# Patient Record
Sex: Female | Born: 1965 | Race: Black or African American | Hispanic: No | Marital: Single | Smoking: Never smoker
Health system: Southern US, Community
[De-identification: ages and names within clinical notes are randomized; demographics above are authoritative.]

---

## 2019-08-19 ENCOUNTER — Emergency Department: Payer: Self-pay

## 2019-08-19 ENCOUNTER — Emergency Department
Admission: EM | Admit: 2019-08-19 | Discharge: 2019-08-19 | Disposition: A | Discharge status: Home / Self Care | Payer: Self-pay | Attending: Emergency Medicine | Admitting: Emergency Medicine

## 2019-08-19 ENCOUNTER — Other Ambulatory Visit: Payer: Self-pay

## 2019-08-19 DIAGNOSIS — M1380 Other specified arthritis, unspecified site: Secondary | ICD-10-CM | POA: Insufficient documentation

## 2019-08-19 DIAGNOSIS — M1990 Unspecified osteoarthritis, unspecified site: Secondary | ICD-10-CM

## 2019-08-19 DIAGNOSIS — M7121 Synovial cyst of popliteal space [Baker], right knee: Secondary | ICD-10-CM | POA: Insufficient documentation

## 2019-08-19 DIAGNOSIS — M25461 Effusion, right knee: Secondary | ICD-10-CM | POA: Insufficient documentation

## 2019-08-19 MED ORDER — KETOROLAC TROMETHAMINE 30 MG/ML IJ SOLN
30.0000 mg | Freq: Once | INTRAMUSCULAR | Status: AC
  Administered 2019-08-19: 22:00:00 30 mg via INTRAMUSCULAR
  Filled 2019-08-19: qty 1

## 2019-08-19 MED ORDER — OXYCODONE-ACETAMINOPHEN 5-325 MG PO TABS
1.0000 | ORAL_TABLET | Freq: Once | ORAL | Status: AC
  Administered 2019-08-19: 1 via ORAL
  Filled 2019-08-19: qty 1

## 2019-08-19 MED ORDER — IBUPROFEN 600 MG PO TABS: 600.0000 mg | ORAL_TABLET | Freq: Four times a day (QID) | ORAL | 0 refills | Status: AC | PRN

## 2019-08-19 NOTE — ED Notes (Signed)
Pt from triage via wheelchair, pt able to transfer from wheelchair to bed on her own, A&Ox4.

## 2019-08-19 NOTE — ED Notes (Signed)
Pt alert and oriented X4, NAD noted at this time.

## 2019-08-19 NOTE — ED Triage Notes (Signed)
Pt reports new pain behind right knee that started Monday, she woke up and it was hurting, pt denies swelling.

## 2019-08-19 NOTE — ED Provider Notes (Signed)
[facility] Emergency Department Provider Note  ____________________________________________  Time seen: Approximately 8:19 PM  I have reviewed the triage vital signs and the nursing notes.   HISTORY  Chief Complaint Knee Pain    HPI Desiree Reyes is a 53 y.o. female that presents to emergency department for evaluation of right posterior knee pain for 5 days.  Patient states that she woke up 1 morning and the back of her knee was hurting.  No trauma.  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   History reviewed. No pertinent past medical history.  There are no active problems to display for this patient.   History reviewed. No pertinent surgical history.  Prior to Admission medications   Medication Sig Start Date End Date Taking? Authorizing Provider  ibuprofen (ADVIL) 600 MG tablet Take 1 tablet (600 mg total) by mouth every 6 (six) hours as needed. 08/19/19   Enid Derry, PA-C    Allergies Strawberry (diagnostic) and Tomato  History reviewed. No pertinent family history.  Social History Social History   Tobacco Use  . Smoking status: Never Smoker  . Smokeless tobacco: Never Used  Substance Use Topics  . Alcohol use: Yes  . Drug use: Never     Review of Systems  Constitutional: No fever/chills Gastrointestinal: No nausea, no vomiting.  Musculoskeletal: Positive for posterior knee pain. Skin: Negative for rash, abrasions, lacerations, ecchymosis. Neurological: Negative for numbness or tingling   ____________________________________________   PHYSICAL EXAM:  VITAL SIGNS: ED Triage Vitals  Enc Vitals Group     BP 08/19/19 1824 125/75     Pulse Rate 08/19/19 1824 91     Resp 08/19/19 1824 18     Temp 08/19/19 1824 98.5 F (36.9 C)     Temp Source 08/19/19 1824 Oral     SpO2 08/19/19 1824 99 %     Weight 08/19/19 1825 198 lb (89.8 kg)     Height 08/19/19 1825 5' (1.524 m)     Head  Circumference --      Peak Flow --      Pain Score 08/19/19 1825 8     Pain Loc --      Pain Edu? --      Excl. in GC? --      Constitutional: Alert and oriented. Well appearing and in no acute distress. Eyes: Conjunctivae are normal. PERRL. EOMI. Head: Atraumatic. ENT:      Ears:      Nose: No congestion/rhinnorhea.      Mouth/Throat: Mucous membranes are moist.  Neck: No stridor.   Cardiovascular: Normal rate, regular rhythm.  Good peripheral circulation. Respiratory: Normal respiratory effort without tachypnea or retractions. Lungs CTAB. Good air entry to the bases with no decreased or absent breath sounds. Musculoskeletal: Full range of motion to all extremities. No gross deformities appreciated.  Tenderness to palpation to popliteal fossa.  Full range of motion of knee with minimal pain.  Normal gait. Neurologic:  Normal speech and language. No gross focal neurologic deficits are appreciated.  Skin:  Skin is warm, dry and intact. No rash noted. Psychiatric: Mood and affect are normal. Speech and behavior are normal. Patient exhibits appropriate insight and judgement.   ____________________________________________   LABS (all labs ordered are listed, but only abnormal results are displayed)  Labs Reviewed - No data to display ____________________________________________  EKG   ____________________________________________  RADIOLOGY Lexine Baton, personally viewed and evaluated these images (plain radiographs)  as part of my medical decision making, as well as reviewing the written report by the radiologist.  [place] Venous Img Lower Unilateral Right  Result Date: 08/19/2019 CLINICAL DATA:  53 year old female with posterior right knee pain x5 days. Concern for DVT. EXAM: Right LOWER EXTREMITY VENOUS DOPPLER ULTRASOUND TECHNIQUE: Gray-scale sonography with graded compression, as well as color Doppler and duplex ultrasound were performed to evaluate the lower extremity deep  venous systems from the level of the common femoral vein and including the common femoral, femoral, profunda femoral, popliteal and calf veins including the posterior tibial, peroneal and gastrocnemius veins when visible. The superficial great saphenous vein was also interrogated. Spectral Doppler was utilized to evaluate flow at rest and with distal augmentation maneuvers in the common femoral, femoral and popliteal veins. COMPARISON:  None. FINDINGS: Contralateral Common Femoral Vein: Respiratory phasicity is normal and symmetric with the symptomatic side. No evidence of thrombus. Normal compressibility. Common Femoral Vein: No evidence of thrombus. Normal compressibility, respiratory phasicity and response to augmentation. Saphenofemoral Junction: No evidence of thrombus. Normal compressibility and flow on color Doppler imaging. Profunda Femoral Vein: No evidence of thrombus. Normal compressibility and flow on color Doppler imaging. Femoral Vein: No evidence of thrombus. Normal compressibility, respiratory phasicity and response to augmentation. Popliteal Vein: No evidence of thrombus. Normal compressibility, respiratory phasicity and response to augmentation. Calf Veins: No evidence of thrombus. Normal compressibility and flow on color Doppler imaging. Superficial Great Saphenous Vein: No evidence of thrombus. Normal compressibility. Venous Reflux:  None. Other Findings: There is a 2.8 x 0.8 x 1.1 cm cystic structure in the right popliteal fossa, likely a Baker's cyst. IMPRESSION: 1. No evidence of deep venous thrombosis. 2. Probable Baker's cyst in the right popliteal fossa. Electronically Signed   By: Elgie CollardArash  Radparvar M.D.   On: 08/19/2019 21:04   Dg Knee Complete 4 Views Right  Result Date: 08/19/2019 CLINICAL DATA:  Right knee pain. Pain for 5 days. EXAM: RIGHT KNEE - COMPLETE 4+ VIEW COMPARISON:  None. FINDINGS: No evidence of fracture or dislocation. No focal bone lesion or bony destructive change.  Mild patellofemoral spurring, trace medial and lateral tibiofemoral spurring. Mild enthesopathic changes about the patellar tendon. Small knee joint effusion. Soft tissues are unremarkable. 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 No acute osseous abnormality. Electronically Signed   By: Narda RutherfordMelanie  Sanford M.D.   On: 08/19/2019 20:06    ____________________________________________    PROCEDURES  Procedure(s) performed:    Procedures    Medications  ketorolac (TORADOL) 30 MG/ML injection 30 mg (30 mg Intramuscular Given 08/19/19 2142)  oxyCODONE-acetaminophen (PERCOCET/ROXICET) 5-325 MG per tablet 1 tablet (1 tablet Oral Given 08/19/19 2141)     ____________________________________________   INITIAL IMPRESSION / ASSESSMENT AND PLAN / ED COURSE  Pertinent labs & imaging results that were available during my care of the patient were reviewed by me and considered in my medical decision making (see chart for details).  Review of the [place] CSRS was performed in accordance of the [facility] prior to dispensing any controlled drugs.   Patient's diagnosis is consistent with Baker's cyst, osteoarthritis, small effusion.  Vital signs and exam are reassuring.  Patient was given Percocet and dose of Toradol for pain.  Knee was Ace wrapped.  Patient will be discharged home with prescriptions for Motrin. Patient is to follow up with ortho as directed. Refferal was given. Patient is given ED precautions to return to the ED for any worsening or new symptoms.  Desiree SaxonHilda Hilyer was  evaluated in Emergency Department on 08/19/2019 for the symptoms described in the history of present illness. She was evaluated in the context of the global COVID-19 pandemic, which necessitated consideration that the patient might be at risk for infection with the SARS-CoV-2 virus that causes COVID-19. Institutional protocols and algorithms that pertain to the evaluation of patients at risk for COVID-19  are in a state of rapid change based on information released by regulatory bodies including the CDC and federal and state organizations. These policies and algorithms were followed during the patient's care in the ED.  ____________________________________________  FINAL CLINICAL IMPRESSION(S) / ED DIAGNOSES  Final diagnoses:  Synovial cyst of right popliteal space  Arthritis  Effusion of right knee      NEW MEDICATIONS STARTED DURING THIS VISIT:  ED Discharge Orders         Ordered    ibuprofen (ADVIL) 600 MG tablet  Every 6 hours PRN     08/19/19 2130              This chart was dictated using voice recognition software/Dragon. Despite best efforts to proofread, errors can occur which can change the meaning. Any change was purely unintentional.    Laban Emperor, PA-C 08/19/19 2306    Earleen Newport, MD 08/19/19 [phone]

## 2021-01-31 ENCOUNTER — Emergency Department
Admission: EM | Admit: 2021-01-31 | Discharge: 2021-02-01 | Disposition: A | Discharge status: Home / Self Care | Payer: Self-pay | Attending: Emergency Medicine | Admitting: Emergency Medicine

## 2021-01-31 ENCOUNTER — Other Ambulatory Visit: Payer: Self-pay

## 2021-01-31 DIAGNOSIS — S139XXA Sprain of joints and ligaments of unspecified parts of neck, initial encounter: Secondary | ICD-10-CM

## 2021-01-31 DIAGNOSIS — M542 Cervicalgia: Secondary | ICD-10-CM | POA: Insufficient documentation

## 2021-01-31 DIAGNOSIS — R519 Headache, unspecified: Secondary | ICD-10-CM | POA: Insufficient documentation

## 2021-01-31 LAB — BASIC METABOLIC PANEL
Anion gap: 9 (ref 5–15)
BUN: 12 mg/dL (ref 6–20)
CO2: 26 mmol/L (ref 22–32)
Calcium: 9.8 mg/dL (ref 8.9–10.3)
Chloride: 99 mmol/L (ref 98–111)
Creatinine, Ser: 0.99 mg/dL (ref 0.44–1.00)
GFR, Estimated: >60 mL/min (ref >60)
Glucose, Bld: 437 mg/dL — ABNORMAL HIGH (ref 70–99)
Potassium: 4.9 mmol/L (ref 3.5–5.1)
Sodium: 134 mmol/L — ABNORMAL LOW (ref 135–145)

## 2021-01-31 MED ORDER — DIAZEPAM 5 MG PO TABS
5.0000 mg | ORAL_TABLET | Freq: Once | ORAL | Status: AC
  Administered 2021-01-31: 5 mg via ORAL
  Filled 2021-01-31: qty 1

## 2021-01-31 MED ORDER — LIDOCAINE 5 % EX PTCH
1.0000 | MEDICATED_PATCH | CUTANEOUS | Status: DC
  Administered 2021-01-31: 1 via TRANSDERMAL
  Filled 2021-01-31: qty 1

## 2021-01-31 NOTE — ED Provider Notes (Signed)
[facility] [place] Emergency Department Provider Note  ____________________________________________   Event Date/Time   First MD Initiated Contact with Patient 01/31/21 2208     (approximate)  I have reviewed the triage vital signs and the nursing notes.   HISTORY  Chief Complaint Headache   HPI Kathye Cipriani is a 55 y.o. female without significant contributory past medical history who presents for assessment of some left neck pain rating into her occiput and top of her head she states initially started 4 to 5 days ago but then seemed to get worse today rating up into the top of her head.  She notes she was in urgent care earlier today where she she received an IM dose of Toradol but this did not seem to help.  Denies any recent injuries falls heavy lifting twisting or any other clear precipitating events.  Denies any vision changes, vertigo, frontal headache, and right-sided neck pain, shortness of breath, chest pain, cough, back pain, abdominal pain, vomiting, diarrhea, dysuria, rash or any other acute sick symptoms.  No history of any collagen vascular disorders and she is not on any blood thinners.  No clear other alleviating aggravating factors.         History reviewed. No pertinent past medical history.  There are no problems to display for this patient.   History reviewed. No pertinent surgical history.  Prior to Admission medications   Medication Sig Start Date End Date Taking? Authorizing Provider  ibuprofen (ADVIL) 600 MG tablet Take 1 tablet (600 mg total) by mouth every 6 (six) hours as needed. 08/19/19   Enid Derry, PA-C    Allergies Strawberry (diagnostic) and Tomato  No family history on file.  Social History Social History   Tobacco Use  . Smoking status: Never Smoker  . Smokeless tobacco: Never Used  Substance Use Topics  . Alcohol use: Yes  . Drug use: Never    Review of Systems  Review of Systems  Constitutional: Negative  for chills and fever.  HENT: Negative for sore throat.   Eyes: Negative for pain.  Respiratory: Negative for cough and stridor.   Cardiovascular: Negative for chest pain.  Gastrointestinal: Negative for vomiting.  Genitourinary: Negative for dysuria.  Musculoskeletal: Positive for neck pain.  Skin: Negative for rash.  Neurological: Positive for headaches. Negative for seizures and loss of consciousness.  Psychiatric/Behavioral: Negative for suicidal ideas.  All other systems reviewed and are negative.     ____________________________________________   PHYSICAL EXAM:  VITAL SIGNS: ED Triage Vitals  Enc Vitals Group     BP 01/31/21 2124 (!) 155/86     Pulse Rate 01/31/21 2124 91     Resp 01/31/21 2124 18     Temp 01/31/21 2124 98.3 F (36.8 C)     Temp Source 01/31/21 2124 Oral     SpO2 01/31/21 2124 98 %     Weight 01/31/21 2124 190 lb (86.2 kg)     Height 01/31/21 2124 5\' 5"  (1.651 m)     Head Circumference --      Peak Flow --      Pain Score 01/31/21 2130 10     Pain Loc --      Pain Edu? --      Excl. in GC? --    Vitals:   01/31/21 2124 01/31/21 2248  BP: (!) 155/86 (!) 135/98  Pulse: 91 92  Resp: 18 17  Temp: 98.3 F (36.8 C)   SpO2: 98% 98%  Physical Exam Vitals and nursing note reviewed.  Constitutional:      General: She is not in acute distress.    Appearance: She is well-developed.  HENT:     Head: Normocephalic and atraumatic.     Right Ear: External ear normal.     Left Ear: External ear normal.     Nose: Nose normal.     Mouth/Throat:     Mouth: Mucous membranes are moist.  Eyes:     Conjunctiva/sclera: Conjunctivae normal.  Cardiovascular:     Rate and Rhythm: Normal rate and regular rhythm.     Pulses: Normal pulses.     Heart sounds: No murmur heard.   Pulmonary:     Effort: Pulmonary effort is normal. No respiratory distress.     Breath sounds: Normal breath sounds.  Abdominal:     Palpations: Abdomen is soft.     Tenderness:  There is no abdominal tenderness. There is no right CVA tenderness or left CVA tenderness.  Musculoskeletal:     Cervical back: Neck supple.  Skin:    General: Skin is warm and dry.  Neurological:     Mental Status: She is alert and oriented to person, place, and time.  Psychiatric:        Mood and Affect: Mood normal.     Some very tight and tender left trapezius muscles.  Neck is otherwise unremarkable.  Oropharynx is unremarkable.  Patient is not truly meningitic. ____________________________________________   LABS (all labs ordered are listed, but only abnormal results are displayed)  Labs Reviewed  BASIC METABOLIC PANEL   ____________________________________________  EKG  ____________________________________________  RADIOLOGY  ED MD interpretation:    Official radiology report(s): No results found.  ____________________________________________   PROCEDURES  Procedure(s) performed (including Critical Care):  .1-3 Lead EKG Interpretation Performed by: Gilles Chiquito, MD Authorized by: Gilles Chiquito, MD     Interpretation: normal     ECG rate assessment: normal     Rhythm: sinus rhythm     Ectopy: none     Conduction: normal       ____________________________________________   INITIAL IMPRESSION / ASSESSMENT AND PLAN / ED COURSE      Patient presents with above-stated exam for assessment of some left-sided neck pain rating to her upper head and seems of gotten worse today after initial beginning for 5 days ago.  This is atraumatic.  She is afebrile hemodynamically stable on arrival.  She did get some Toradol in urgent care earlier today but this did not seem to help.  She is quite tender in is very tight muscles in her left neck.  Suspect likely muscle spasm although current dissection is also in differential.  No history or exam findings to suggest acute traumatic injury, significant metabolic derangement or acute infectious process.  Patient  treated with Valium and lidocaine patch.  She stated she felt very much still in pain on my reassessment and this will plan to obtain CT imaging of the head and neck to rule out any acute vascular pathology.  If this is unremarkable I think patient can likely be safe to be discharged home with outpatient follow-up.   ____________________________________________   FINAL CLINICAL IMPRESSION(S) / ED DIAGNOSES  Final diagnoses:  Neck pain    Medications  lidocaine (LIDODERM) 5 % 1 patch (1 patch Transdermal Patch Applied 01/31/21 2238)  diazepam (VALIUM) tablet 5 mg (5 mg Oral Given 01/31/21 2237)     ED Discharge Orders  None       Note:  This document was prepared using Dragon voice recognition software and may include unintentional dictation errors.   Gilles Chiquito, MD 01/31/21 [phone]

## 2021-01-31 NOTE — ED Triage Notes (Signed)
Pt states she was seen at urgent care for pain in left side of neck that is now on top on head. Pt states she was given shot in the butt that relieved her symptoms temporarily.

## 2021-02-01 ENCOUNTER — Emergency Department: Payer: Self-pay

## 2021-02-01 ENCOUNTER — Encounter: Payer: Self-pay | Admitting: Radiology

## 2021-02-01 MED ORDER — IOHEXOL 350 MG/ML SOLN
75.0000 mL | Freq: Once | INTRAVENOUS | Status: AC | PRN
  Administered 2021-02-01: 75 mL via INTRAVENOUS

## 2021-02-01 NOTE — ED Provider Notes (Signed)
I was asked by Dr. Katrinka Blazing to follow-up results of CT of the neck which were all negative.  Patient remains neurologically intact.  Will discharge home with supportive care and follow-up with PCP per his plan.    I have personally reviewed the images performed during this visit and I agree with the Radiologist's read.   Interpretation by Radiologist:  CT ANGIOGRAM HEAD NECK W WO CONTRAST  Result Date: 02/01/2021 CLINICAL DATA:  Nonspecific dizziness with left-sided neck pain EXAM: CT ANGIOGRAPHY HEAD AND NECK TECHNIQUE: Multidetector CT imaging of the head and neck was performed using the standard protocol during bolus administration of intravenous contrast. Multiplanar CT image reconstructions and MIPs were obtained to evaluate the vascular anatomy. Carotid stenosis measurements (when applicable) are obtained utilizing NASCET criteria, using the distal internal carotid diameter as the denominator. CONTRAST:  59mL OMNIPAQUE IOHEXOL 350 MG/ML SOLN COMPARISON:  None. FINDINGS: CT HEAD FINDINGS Brain: There is no mass, hemorrhage or extra-axial collection. The size and configuration of the ventricles and extra-axial CSF spaces are normal. There is no acute or chronic infarction. The brain parenchyma is normal. Skull: The visualized skull base, calvarium and extracranial soft tissues are normal. Sinuses/Orbits: No fluid levels or advanced mucosal thickening of the visualized paranasal sinuses. No mastoid or middle ear effusion. The orbits are normal. CTA NECK FINDINGS SKELETON: There is no bony spinal canal stenosis. No lytic or blastic lesion. OTHER NECK: Normal pharynx, larynx and major salivary glands. No cervical lymphadenopathy. Unremarkable thyroid gland. UPPER CHEST: No pneumothorax or pleural effusion. No nodules or masses. AORTIC ARCH: There is no calcific atherosclerosis of the aortic arch. There is no aneurysm, dissection or hemodynamically significant stenosis of the visualized portion of the aorta.  Conventional 3 vessel aortic branching pattern. The visualized proximal subclavian arteries are widely patent. RIGHT CAROTID SYSTEM: Normal without aneurysm, dissection or stenosis. LEFT CAROTID SYSTEM: Normal without aneurysm, dissection or stenosis. VERTEBRAL ARTERIES: Left dominant configuration. Both origins are clearly patent. There is no dissection, occlusion or flow-limiting stenosis to the skull base (V1-V3 segments). CTA HEAD FINDINGS POSTERIOR CIRCULATION: --Vertebral arteries: Normal V4 segments. --Inferior cerebellar arteries: Normal. --Basilar artery: Normal. --Superior cerebellar arteries: Normal. --Posterior cerebral arteries (PCA): Normal. ANTERIOR CIRCULATION: --Intracranial internal carotid arteries: Normal. --Anterior cerebral arteries (ACA): Normal. Both A1 segments are present. Patent anterior communicating artery (a-comm). --Middle cerebral arteries (MCA): Normal. VENOUS SINUSES: As permitted by contrast timing, patent. ANATOMIC VARIANTS: None Review of the MIP images confirms the above findings. IMPRESSION: Normal CTA of the head and neck. Electronically Signed   By: Deatra Robinson M.D.   On: 02/01/2021 00:51      Nita Sickle, MD 02/01/21 [phone]

## 2022-01-21 IMAGING — CT CT ANGIO HEAD-NECK (W OR W/O PERF)
2 of 10 series · 9 of 33 positions shown · IV contrast (APPLIED)
Comparison: None.

CLINICAL DATA: Nonspecific dizziness with left-sided neck pain

EXAM:
CT ANGIOGRAPHY HEAD AND NECK
TECHNIQUE: Multidetector CT imaging of the head and neck was performed using
the standard protocol during bolus administration of intravenous
contrast. Multiplanar CT image reconstructions and MIPs were
obtained to evaluate the vascular anatomy. Carotid stenosis
measurements (when applicable) are obtained utilizing NASCET
criteria, using the distal internal carotid diameter as the
denominator.
CONTRAST:  75mL OMNIPAQUE IOHEXOL 350 MG/ML SOLN

[Series 9: cta head neck · axial · 0.44mm/px · z∈[-353,-7]mm · 3 of 174 slices shown]
[im 1/174  soft-tissue]
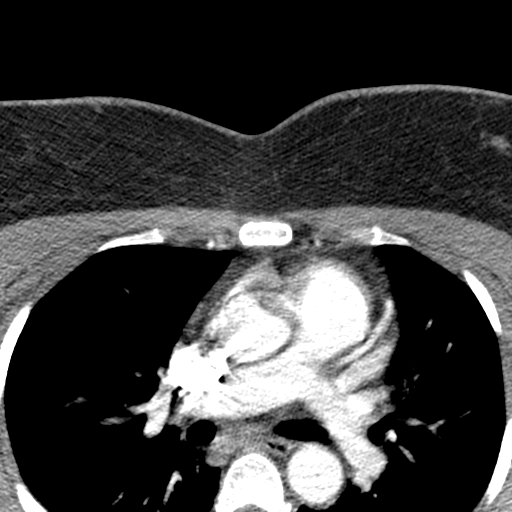
[im 87/174  bone]
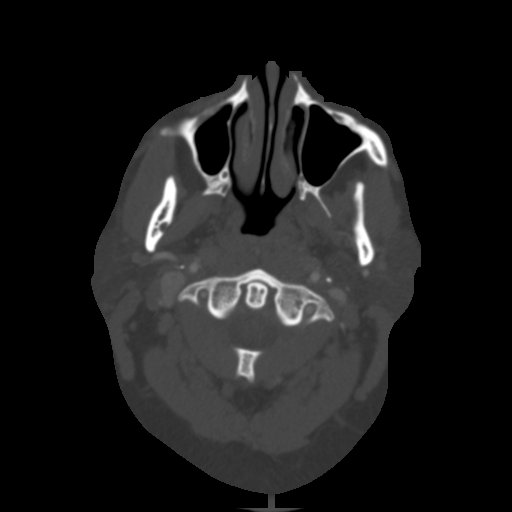
[im 174/174  soft-tissue]
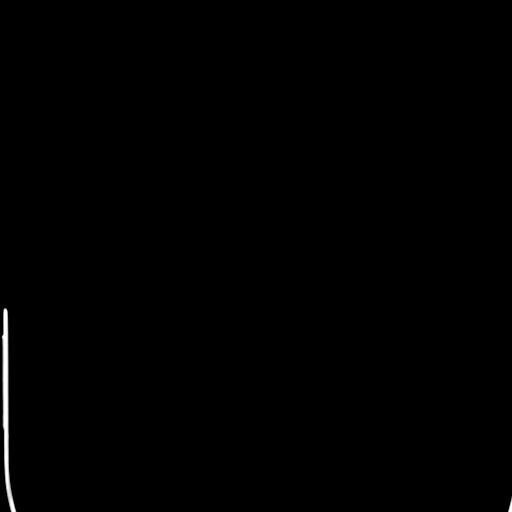

[Series 11: ax thin · axial · 0.37mm/px · z∈[-323,-84]mm · 6 of 339 slices shown]
[im 49/339  soft-tissue]
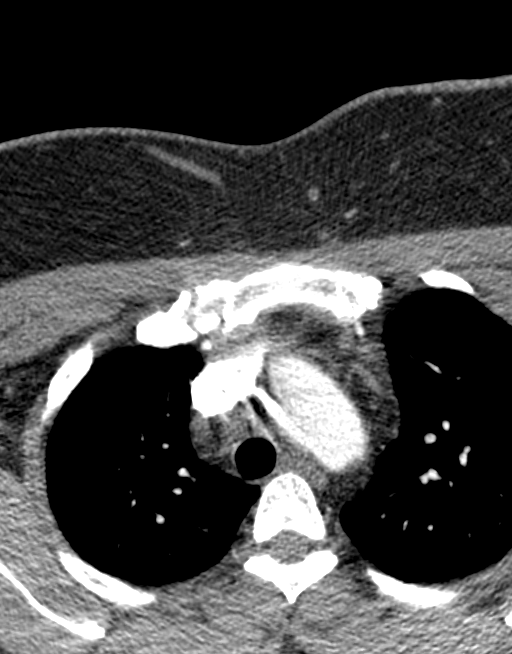
[im 97/339  soft-tissue]
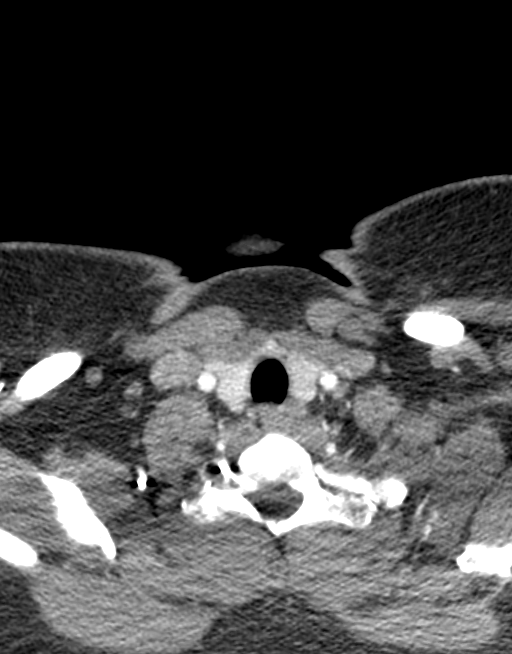
[im 145/339  soft-tissue]
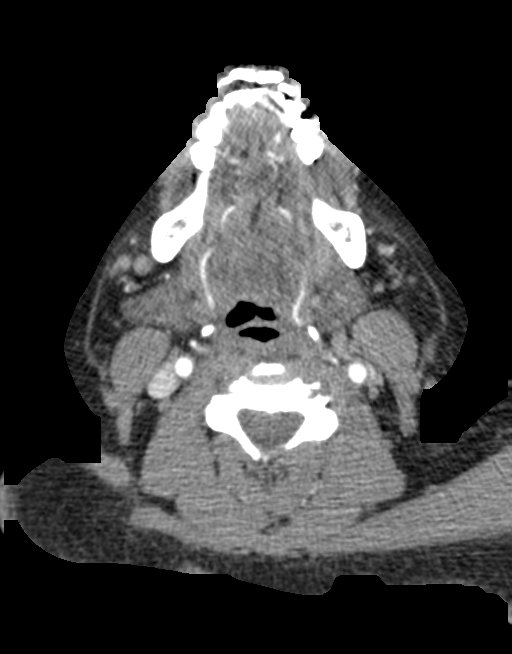
[im 194/339  soft-tissue]
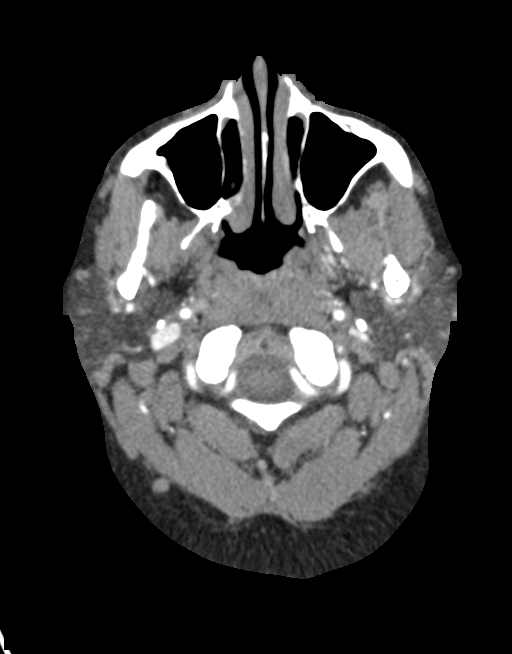
[im 242/339  soft-tissue]
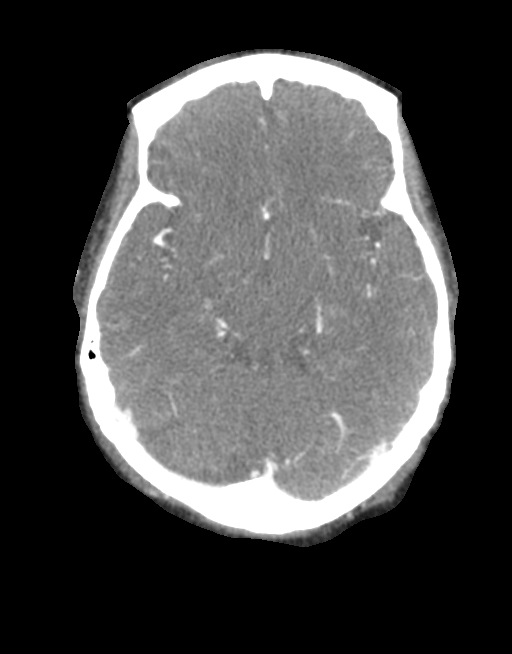
[im 290/339  soft-tissue]
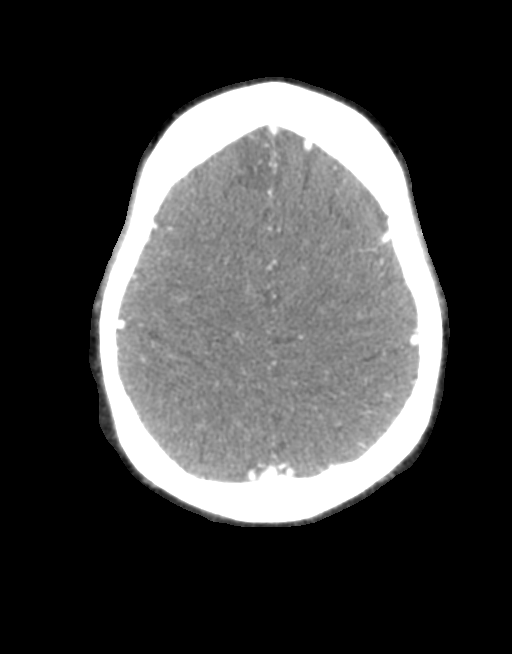

[9 of 33 positions shown; findings below may reference images not displayed]

FINDINGS: CT HEAD FINDINGS

Brain: There is no mass, hemorrhage or extra-axial collection. The
size and configuration of the ventricles and extra-axial CSF spaces
are normal. There is no acute or chronic infarction. The brain
parenchyma is normal.

Skull: The visualized skull base, calvarium and extracranial soft
tissues are normal.

Sinuses/Orbits: No fluid levels or advanced mucosal thickening of
the visualized paranasal sinuses. No mastoid or middle ear effusion.
The orbits are normal.

CTA NECK FINDINGS

SKELETON: There is no bony spinal canal stenosis. No lytic or
blastic lesion.

OTHER NECK: Normal pharynx, larynx and major salivary glands. No
cervical lymphadenopathy. Unremarkable thyroid gland.

UPPER CHEST: No pneumothorax or pleural effusion. No nodules or
masses.

AORTIC ARCH:

There is no calcific atherosclerosis of the aortic arch. There is no
aneurysm, dissection or hemodynamically significant stenosis of the
visualized portion of the aorta. Conventional 3 vessel aortic
branching pattern. The visualized proximal subclavian arteries are
widely patent.

RIGHT CAROTID SYSTEM: Normal without aneurysm, dissection or
stenosis.

LEFT CAROTID SYSTEM: Normal without aneurysm, dissection or
stenosis.

VERTEBRAL ARTERIES: Left dominant configuration. Both origins are
clearly patent. There is no dissection, occlusion or flow-limiting
stenosis to the skull base (V1-V3 segments).

CTA HEAD FINDINGS

POSTERIOR CIRCULATION:

--Vertebral arteries: Normal V4 segments.

--Inferior cerebellar arteries: Normal.

--Basilar artery: Normal.

--Superior cerebellar arteries: Normal.

--Posterior cerebral arteries (PCA): Normal.

ANTERIOR CIRCULATION:

--Intracranial internal carotid arteries: Normal.

--Anterior cerebral arteries (ACA): Normal. Both A1 segments are
present. Patent anterior communicating artery (a-comm).

--Middle cerebral arteries (MCA): Normal.

VENOUS SINUSES: As permitted by contrast timing, patent.

ANATOMIC VARIANTS: None

Review of the MIP images confirms the above findings.
IMPRESSION: Normal CTA of the head and neck.
